# Patient Record
Sex: Female | Born: 1965 | Race: White | Hispanic: No | Marital: Married | Smoking: Never smoker
Health system: Southern US, Community
[De-identification: ages and names within clinical notes are randomized; demographics above are authoritative.]

## PROBLEM LIST (undated history)

## (undated) HISTORY — PX: OTHER SURGICAL HISTORY: SHX169

---

## 2010-01-12 ENCOUNTER — Other Ambulatory Visit
Admission: RE | Admit: 2010-01-12 | Discharge: 2010-01-12 | Payer: Self-pay | Source: Home / Self Care | Admitting: Radiology

## 2013-03-06 ENCOUNTER — Ambulatory Visit: Payer: Self-pay | Admitting: Podiatry

## 2013-03-07 ENCOUNTER — Encounter: Payer: Self-pay | Admitting: Podiatry

## 2013-03-07 ENCOUNTER — Ambulatory Visit (INDEPENDENT_AMBULATORY_CARE_PROVIDER_SITE_OTHER): Payer: PRIVATE HEALTH INSURANCE

## 2013-03-07 ENCOUNTER — Ambulatory Visit (INDEPENDENT_AMBULATORY_CARE_PROVIDER_SITE_OTHER): Payer: PRIVATE HEALTH INSURANCE | Admitting: Podiatry

## 2013-03-07 ENCOUNTER — Ambulatory Visit: Payer: Self-pay | Admitting: Podiatry

## 2013-03-07 VITALS — BP 155/94 | HR 75 | Resp 16 | Ht 62.0 in | Wt 120.0 lb

## 2013-03-07 DIAGNOSIS — M79676 Pain in unspecified toe(s): Secondary | ICD-10-CM

## 2013-03-07 DIAGNOSIS — I739 Peripheral vascular disease, unspecified: Secondary | ICD-10-CM

## 2013-03-07 DIAGNOSIS — M79609 Pain in unspecified limb: Secondary | ICD-10-CM

## 2013-03-07 DIAGNOSIS — M2040 Other hammer toe(s) (acquired), unspecified foot: Secondary | ICD-10-CM

## 2013-03-07 NOTE — Progress Notes (Signed)
   Subjective:    Patient ID: Heidi Robertson, female    DOB: May 01, 1965, 48 y.o.   MRN: [id number]  HPI Comments: "I have a problem with my toes"  Patient C/O red, bruised appearance on the plantar tips of her toes bilateral since November 2014. She first noticed this happening while in [place]. She states that they feel like they are on fire when temps are cold. Sometimes swelling. PCP evaluated, no treatment.    Also has noticed white spots on her toenails  Toe Pain       Review of Systems  All other systems reviewed and are negative.       Objective:   Physical Exam        Assessment & Plan:

## 2013-03-08 NOTE — Progress Notes (Signed)
Subjective:     Patient ID: Heidi Robertson, female   DOB: August 28, 1965, 48 y.o.   MRN: [id number]  Toe Pain    patient states that she exposed her toes to significant cold in November and did a lot of outdoor walking and she did note some change in her digits and that over the last 3 weeks it they have again been exposed and she's noted increased discoloration of the digits but she is concerned about   Review of Systems  All other systems reviewed and are negative.       Objective:   Physical Exam  Nursing note and vitals reviewed. Constitutional: She is oriented to person, place, and time.  Cardiovascular: Intact distal pulses.   Musculoskeletal: Normal range of motion.  Neurological: She is oriented to person, place, and time.  Skin: Skin is warm.   neurovascular status intact with range of motion adequate and toes noted to be well-perfused with good Fill time. 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 I did explain keeping her feet warm and doing gradual soaks and to watch him carefully. If any changes occur let us know and I want her to be more careful next winter when she exposes them to call

## 2018-12-11 ENCOUNTER — Emergency Department (HOSPITAL_COMMUNITY)
Admission: EM | Admit: 2018-12-11 | Discharge: 2018-12-11 | Disposition: A | Discharge status: Home / Self Care | Payer: BLUE CROSS/BLUE SHIELD | Attending: Emergency Medicine | Admitting: Emergency Medicine

## 2018-12-11 ENCOUNTER — Encounter (HOSPITAL_COMMUNITY): Payer: Self-pay

## 2018-12-11 ENCOUNTER — Other Ambulatory Visit: Payer: Self-pay

## 2018-12-11 ENCOUNTER — Emergency Department (HOSPITAL_COMMUNITY): Payer: BLUE CROSS/BLUE SHIELD

## 2018-12-11 DIAGNOSIS — N1330 Unspecified hydronephrosis: Secondary | ICD-10-CM | POA: Insufficient documentation

## 2018-12-11 DIAGNOSIS — R109 Unspecified abdominal pain: Secondary | ICD-10-CM | POA: Diagnosis present

## 2018-12-11 DIAGNOSIS — Z79899 Other long term (current) drug therapy: Secondary | ICD-10-CM | POA: Diagnosis not present

## 2018-12-11 DIAGNOSIS — N201 Calculus of ureter: Secondary | ICD-10-CM | POA: Diagnosis not present

## 2018-12-11 LAB — COMPREHENSIVE METABOLIC PANEL
ALT: 27 U/L (ref 0–44)
AST: 28 U/L (ref 15–41)
Albumin: 4.7 g/dL (ref 3.5–5.0)
Alkaline Phosphatase: 88 U/L (ref 38–126)
Anion gap: 9 (ref 5–15)
BUN: 17 mg/dL (ref 6–20)
CO2: 26 mmol/L (ref 22–32)
Calcium: 9.2 mg/dL (ref 8.9–10.3)
Chloride: 105 mmol/L (ref 98–111)
Creatinine, Ser: 0.73 mg/dL (ref 0.44–1.00)
GFR calc Af Amer: >60 mL/min (ref >60)
GFR calc non Af Amer: >60 mL/min (ref >60)
Glucose, Bld: 116 mg/dL — ABNORMAL HIGH (ref 70–99)
Potassium: 3.8 mmol/L (ref 3.5–5.1)
Sodium: 140 mmol/L (ref 135–145)
Total Bilirubin: 0.8 mg/dL (ref 0.3–1.2)
Total Protein: 7.6 g/dL (ref 6.5–8.1)

## 2018-12-11 LAB — CBC WITH DIFFERENTIAL/PLATELET
Abs Immature Granulocytes: 0.05 10*3/uL (ref 0.00–0.07)
Basophils Absolute: 0.1 10*3/uL (ref 0.0–0.1)
Basophils Relative: 1 %
Eosinophils Absolute: 0.1 10*3/uL (ref 0.0–0.5)
Eosinophils Relative: 1 %
HCT: 40.5 % (ref 36.0–46.0)
Hemoglobin: 13.3 g/dL (ref 12.0–15.0)
Immature Granulocytes: 1 %
Lymphocytes Relative: 11 %
Lymphs Abs: 1.2 10*3/uL (ref 0.7–4.0)
MCH: 30.8 pg (ref 26.0–34.0)
MCHC: 32.8 g/dL (ref 30.0–36.0)
MCV: 93.8 fL (ref 80.0–100.0)
Monocytes Absolute: 0.7 10*3/uL (ref 0.1–1.0)
Monocytes Relative: 6 %
Neutro Abs: 8.9 10*3/uL — ABNORMAL HIGH (ref 1.7–7.7)
Neutrophils Relative %: 80 %
Platelets: 274 10*3/uL (ref 150–400)
RBC: 4.32 MIL/uL (ref 3.87–5.11)
RDW: 12.5 % (ref 11.5–15.5)
WBC: 11 10*3/uL — ABNORMAL HIGH (ref 4.0–10.5)
nRBC: 0 % (ref 0.0–0.2)

## 2018-12-11 LAB — URINALYSIS, ROUTINE W REFLEX MICROSCOPIC
Bacteria, UA: NONE SEEN
Bilirubin Urine: NEGATIVE
Glucose, UA: NEGATIVE mg/dL
Ketones, ur: 5 mg/dL — AB
Leukocytes,Ua: NEGATIVE
Nitrite: NEGATIVE
Protein, ur: NEGATIVE mg/dL
Specific Gravity, Urine: 1.018 (ref 1.005–1.030)
pH: 7 (ref 5.0–8.0)

## 2018-12-11 LAB — POC URINE PREG, ED: Preg Test, Ur: NEGATIVE

## 2018-12-11 LAB — LIPASE, BLOOD: Lipase: 38 U/L (ref 11–51)

## 2018-12-11 MED ORDER — TAMSULOSIN HCL 0.4 MG PO CAPS: 0.4000 mg | ORAL_CAPSULE | Freq: Every day | ORAL | 0 refills | Status: AC

## 2018-12-11 MED ORDER — HYDROCODONE-ACETAMINOPHEN 5-325 MG PO TABS: 1.0000 | ORAL_TABLET | Freq: Four times a day (QID) | ORAL | 0 refills | Status: AC | PRN

## 2018-12-11 MED ORDER — ONDANSETRON HCL 4 MG PO TABS: 4.0000 mg | ORAL_TABLET | Freq: Three times a day (TID) | ORAL | 0 refills | Status: AC | PRN

## 2018-12-11 MED ORDER — IBUPROFEN 600 MG PO TABS: 600.0000 mg | ORAL_TABLET | Freq: Four times a day (QID) | ORAL | 0 refills | Status: AC | PRN

## 2018-12-11 NOTE — ED Provider Notes (Signed)
[facility]-EMERGENCY DEPT Provider Note   CSN: [id number] Arrival date & time: 12/11/18  1723     History   Chief Complaint Chief Complaint  Patient presents with  . Back Pain  . Abdominal Pain  . Hematuria    HPI Heidi Robertson is a 53 y.o. female presents today for evaluation of acute onset, resolved right flank pain.  Reports symptoms began at around 1 PM after eating lunch with her mother.  Over the course of 3 hours the pain became very severe so she sought evaluation at her PCPs office.  Reports sharp pain, could not get comfortable.  Pain radiated from the right flank around to the right groin and she felt a pressure in her pelvis.  Also noted urinary urgency.  Denied hematuria or dysuria.  Nausea but no vomiting.  No diarrhea, constipation, melena, hematochezia, shortness of breath, chest pain, or fevers.  No aggravating relieving factors noted.  Her PCP gave her Phenergan and Toradol with complete resolution of her symptoms.  No known history of kidney stones but reports her father had kidney stones.     The history is provided by the patient.    History reviewed. No pertinent past medical history.  There are no active problems to display for this patient.   Past Surgical History:  Procedure Laterality Date  . aram fracture surgery       OB History   No obstetric history on file.      Home Medications    Prior to Admission medications   Medication Sig Start Date End Date Taking? Authorizing Provider  ALPRAZOLAM PO Take by mouth.    [provider]  Ginkgo Biloba (GINKOBA PO) Take by mouth.    [provider]  HYDROcodone-acetaminophen (NORCO/VICODIN) 5-325 MG tablet Take 1 tablet by mouth every 6 (six) hours as needed for severe pain. 12/11/18   Samon Dishner A, PA-C  ibuprofen (ADVIL) 600 MG tablet Take 1 tablet (600 mg total) by mouth every 6 (six) hours as needed. 12/11/18   Alyss Granato A, PA-C  Multiple Vitamin  (MULTIVITAMIN) capsule Take 1 capsule by mouth daily.    [provider]  ondansetron (ZOFRAN) 4 MG tablet Take 1 tablet (4 mg total) by mouth every 8 (eight) hours as needed for nausea or vomiting. 12/11/18   Shawnia Vizcarrondo A, PA-C  Pseudoephedrine-Acetaminophen (PSEUDOEPHEDRINE SINUS PO) Take by mouth.    [provider]  tamsulosin (FLOMAX) 0.4 MG CAPS capsule Take 1 capsule (0.4 mg total) by mouth daily after supper for 5 days. 12/11/18 12/16/18  Jeanie SewerFawze, Brolin Dambrosia A, PA-C    Family History Family History  Problem Relation Age of Onset  . Cancer Father   . Heart attack Father   . Hypertension Father   . Diabetes Father   . Kidney Stones Father     Social History Social History   Tobacco Use  . Smoking status: Never Smoker  . Smokeless tobacco: Never Used  Substance Use Topics  . Alcohol use: Never    Frequency: Never  . Drug use: Never     Allergies   Patient has no known allergies.   Review of Systems Review of Systems  Constitutional: Negative for chills and fever.  Respiratory: Negative for shortness of breath.   Cardiovascular: Negative for chest pain.  Gastrointestinal: Positive for nausea. Negative for abdominal pain, constipation, diarrhea and vomiting.  Genitourinary: Positive for flank pain, frequency and urgency. Negative for dysuria, vaginal bleeding, vaginal discharge and  vaginal pain.  All other systems reviewed and are negative.    Physical Exam Updated Vital Signs BP (!) 141/89   Pulse 67   Temp 98.4 F (36.9 C) (Oral)   Resp 18   Ht 5\' 2"  (1.575 m)   Wt 59 kg   SpO2 99%   BMI 23.78 kg/m   Physical Exam Vitals signs and nursing note reviewed.  Constitutional:      General: She is not in acute distress.    Appearance: She is well-developed.  HENT:     Head: Normocephalic and atraumatic.  Eyes:     General:        Right eye: No discharge.        Left eye: No discharge.     Conjunctiva/sclera: Conjunctivae normal.  Neck:      Vascular: No JVD.     Trachea: No tracheal deviation.  Cardiovascular:     Rate and Rhythm: Normal rate and regular rhythm.  Pulmonary:     Effort: Pulmonary effort is normal.     Breath sounds: Normal breath sounds.  Abdominal:     General: Abdomen is flat. Bowel sounds are normal. There is no distension.     Palpations: Abdomen is soft.     Tenderness: There is abdominal tenderness in the suprapubic area. There is no right CVA tenderness, left CVA tenderness, guarding or rebound. Negative signs include Murphy's sign and Rovsing's sign.  Skin:    General: Skin is warm and dry.     Findings: No erythema.  Neurological:     Mental Status: She is alert.  Psychiatric:        Behavior: Behavior normal.      ED Treatments / Results  Labs (all labs ordered are listed, but only abnormal results are displayed) Labs Reviewed  URINALYSIS, ROUTINE W REFLEX MICROSCOPIC - Abnormal; Notable for the following components:      Result Value   APPearance HAZY (*)    Hgb urine dipstick SMALL (*)    Ketones, ur 5 (*)    All other components within normal limits  CBC WITH DIFFERENTIAL/PLATELET - Abnormal; Notable for the following components:   WBC 11.0 (*)    Neutro Abs 8.9 (*)    All other components within normal limits  COMPREHENSIVE METABOLIC PANEL - Abnormal; Notable for the following components:   Glucose, Bld 116 (*)    All other components within normal limits  LIPASE, BLOOD  POC URINE PREG, ED    EKG None  Radiology Ct Renal Stone Study  Result Date: 12/11/2018 CLINICAL DATA:  Flank pain with stone disease suspected. Right-sided flank and back pain. EXAM: CT ABDOMEN AND PELVIS WITHOUT CONTRAST TECHNIQUE: Multidetector CT imaging of the abdomen and pelvis was performed following the standard protocol without IV contrast. COMPARISON:  None. FINDINGS: Lower chest: The lung bases are clear. The heart size is normal. Hepatobiliary: There is a 1.7 cm cyst in the right hepatic lobe.  Normal gallbladder.There is no biliary ductal dilation. Pancreas: Normal contours without ductal dilatation. No peripancreatic fluid collection. Spleen: No splenic laceration or hematoma. Adrenals/Urinary Tract: --Adrenal glands: No adrenal hemorrhage. --Right kidney/ureter: There is moderate right-sided hydroureteronephrosis secondary to a 3 mm stone in the distal right ureter at the right UVJ. --Left kidney/ureter: There are small punctate nonobstructing stones in lower pole the left kidney. --Urinary bladder: The urinary bladder is decompressed which limits evaluation. Stomach/Bowel: --Stomach/Duodenum: No hiatal hernia or other gastric abnormality. Normal duodenal course and caliber. --Small  bowel: No dilatation or inflammation. --Colon: No focal abnormality. --Appendix: Normal. Vascular/Lymphatic: Normal course and caliber of the major abdominal vessels. --No retroperitoneal lymphadenopathy. --No mesenteric lymphadenopathy. --No pelvic or inguinal lymphadenopathy. Reproductive: Unremarkable Other: No ascites or free air. The abdominal wall is normal. Musculoskeletal. No acute displaced fractures. IMPRESSION: 1. Moderate right-sided hydroureteronephrosis secondary to a 3 mm stone in the distal right ureter at the right UVJ. 2. Nonobstructive left nephrolithiasis. Electronically Signed   By: Constance Holster M.D.   On: 12/11/2018 19:47    Procedures Procedures (including critical care time)  Medications Ordered in ED Medications - No data to display   Initial Impression / Assessment and Plan / ED Course  I have reviewed the triage vital signs and the nursing notes.  Pertinent labs & imaging results that were available during my care of the patient were reviewed by me and considered in my medical decision making (see chart for details).        Patient presents for evaluation of acute onset, resolved episode of severe colicky right flank pain radiating to the groin with associated urinary  symptoms.  Went to her PCP who noted blood in her urine and gave her Toradol and Phenergan with resolution of her symptoms.  She is afebrile, mildly hypertensive in the ED but vital signs otherwise stable and she is resting comfortably in chair.  Nontoxic in appearance.  No tenderness to palpation of the midline spine, no red flag signs concerning for cauda equina or spinal abscess and she is ambulatory in the ED without difficulty.  Abdomen is soft and entirely nontender.  Her UA does not suggest UTI but does have blood in it suggesting possible nephrolithiasis.  Remainder of blood work reviewed by me shows mild nonspecific leukocytosis, no anemia, no renal insufficiency, no metabolic derangements.  She has no vaginal itching, bleeding, or discharge and I doubt PID, TOA, ovarian torsion, or ectopic pregnancy.  Renal stone study was obtained which shows moderate right-sided hydroureteronephrosis secondary to a 3 mm stone in the right UVJ.  No evidence of acute surgical abdominal pathology.  On reevaluation patient remains resting comfortably in no apparent distress.  She has had no recurrence in her symptoms.  Discussed management with NSAIDs, Flomax, Zofran for nausea, hydrocodone for severe breakthrough pain while she is passing the stone.  She will collect the stone to take to urology for follow-up on an outpatient basis.  Discussed strict ED return precautions. Pt verbalized understanding of and agreement with plan and is safe for discharge home at this time.   Final Clinical Impressions(s) / ED Diagnoses   Final diagnoses:  Right ureteral calculus  Hydronephrosis of right kidney    ED Discharge Orders         Ordered    ibuprofen (ADVIL) 600 MG tablet  Every 6 hours PRN     12/11/18 2045    HYDROcodone-acetaminophen (NORCO/VICODIN) 5-325 MG tablet  Every 6 hours PRN     12/11/18 2045    tamsulosin (FLOMAX) 0.4 MG CAPS capsule  Daily after supper     12/11/18 2045    ondansetron (ZOFRAN) 4 MG  tablet  Every 8 hours PRN     12/11/18 2046           Renita Papa, PA-C 12/12/18 1146    Valarie Merino, MD 12/15/18 1623

## 2018-12-11 NOTE — ED Triage Notes (Signed)
Patient states that she began having right lower back pain and right lower abdominal pain, vaginal pressure today. Patient went to her PCP today and was told to come to the ED due to hematuria and a negative urinalysis. Patient denies any pain at this time. Patient states she was given a pain injection prior to coming to the ED.

## 2018-12-11 NOTE — ED Notes (Signed)
An After Visit Summary was printed and given to the patient. Discharge instructions given and no further questions at this time. Pt leaving with urine filter and hat for urinating.

## 2018-12-11 NOTE — Discharge Instructions (Signed)
1. Medications: Take 600 mg of ibuprofen every 6 hours as needed for pain. Take ibuprofen with food to avoid upset stomach issues.  Take hydrocodone as needed for severe pain but do not drive, drink alcohol, operate heavy machinery while taking this medication as it can cause drowsiness.  Take Zofran as needed for nausea.  Let this medicine dissolve under your tongue and wait around 10-15 minutes before eating or drinking after taking this medication to give it time to work.  Take Flomax as prescribed.  This medicine will open up the ureter(the tube connecting the bladder to the kidney) to allow for easier passage of the kidney stone.  Be aware this medication can cause a drop in your blood pressure so be careful when going from laying or sitting to standing as you may feel lightheaded.  If you feel very fatigued, lightheaded, or have persistently low blood pressures, stop taking this medication entirely. 2. Treatment: rest, drink plenty of fluids.  3. Follow Up: Please followup with urology as soon as possible (preferably this week) for discussion of your diagnoses and further evaluation after today's visit; Please return to the ER for persistent vomiting, high fevers or worsening symptoms

## 2020-03-31 IMAGING — CT CT RENAL STONE PROTOCOL
2 of 4 series · 16 of 46 positions shown, 18 images · non-contrast
Comparison: None.

CLINICAL DATA: Flank pain with stone disease suspected. Right-sided
flank and back pain.

EXAM:
CT ABDOMEN AND PELVIS WITHOUT CONTRAST
TECHNIQUE: Multidetector CT imaging of the abdomen and pelvis was performed
following the standard protocol without IV contrast.

[Series 2: axial st · axial · 0.64mm/px · z∈[-463,-93]mm · 13 of 84 slices shown, 15 images]
[im 5/84  soft-tissue]
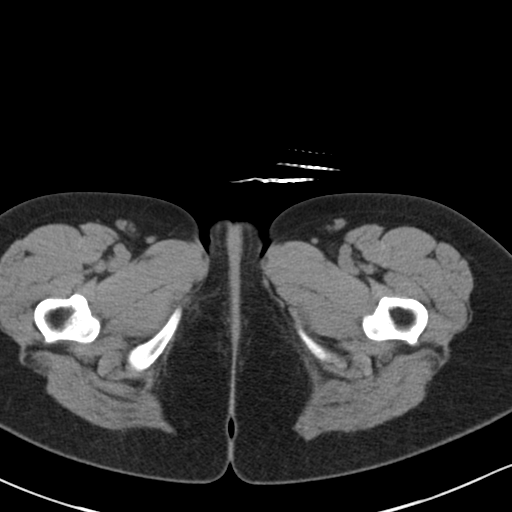
[im 5/84  bone]
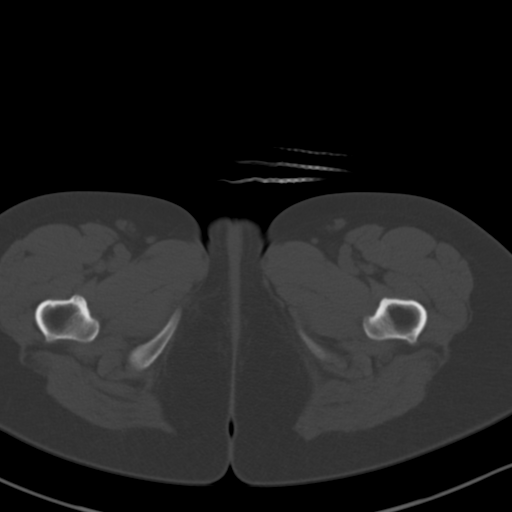
[im 10/84  soft-tissue]
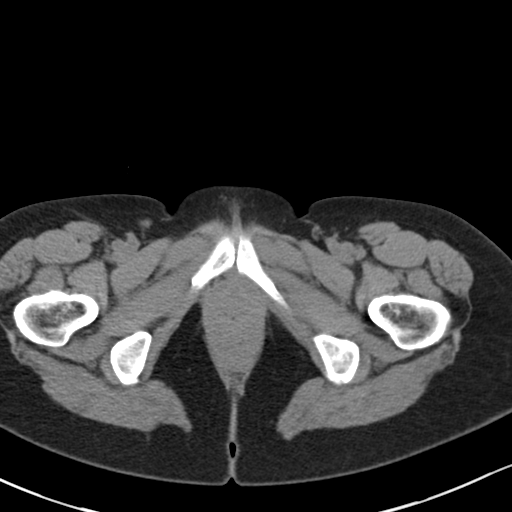
[im 20/84  soft-tissue]
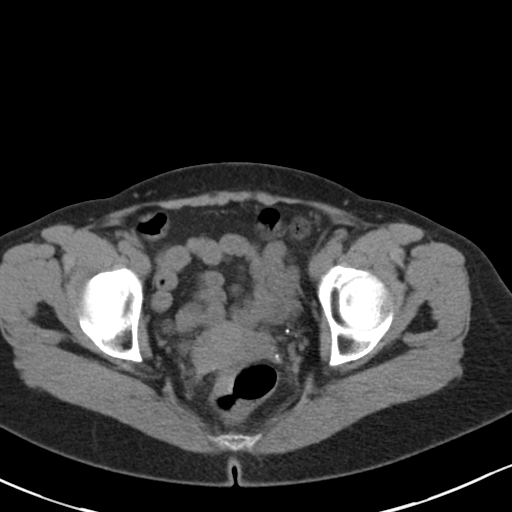
[im 25/84  soft-tissue]
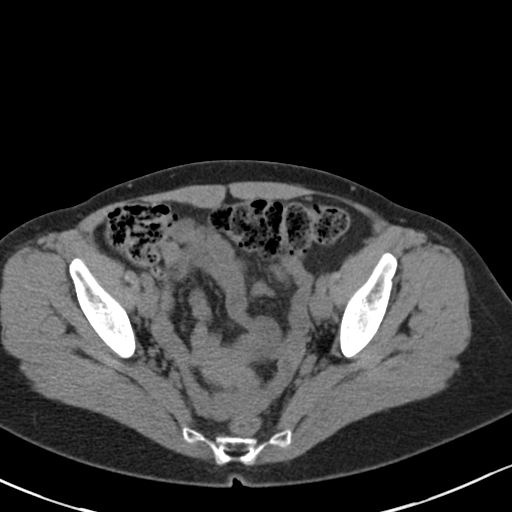
[im 30/84  soft-tissue]
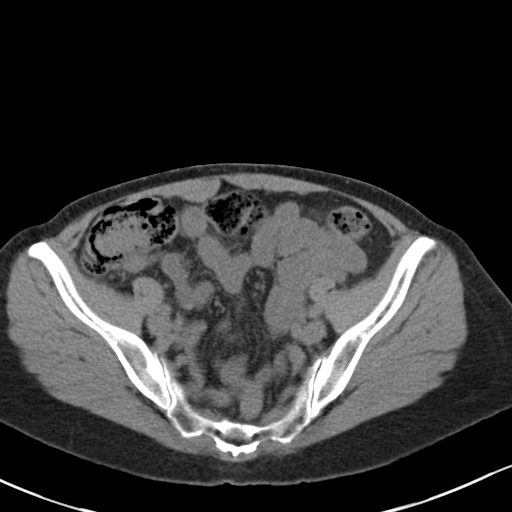
[im 35/84  soft-tissue]
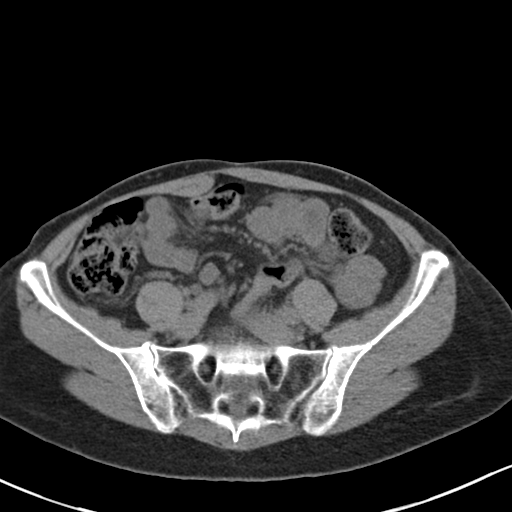
[im 44/84  soft-tissue]
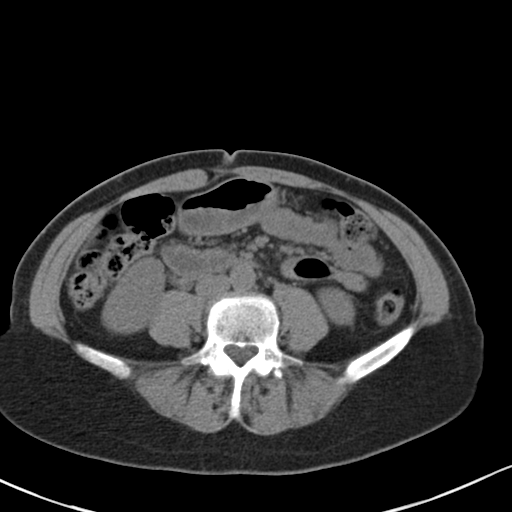
[im 49/84  soft-tissue]
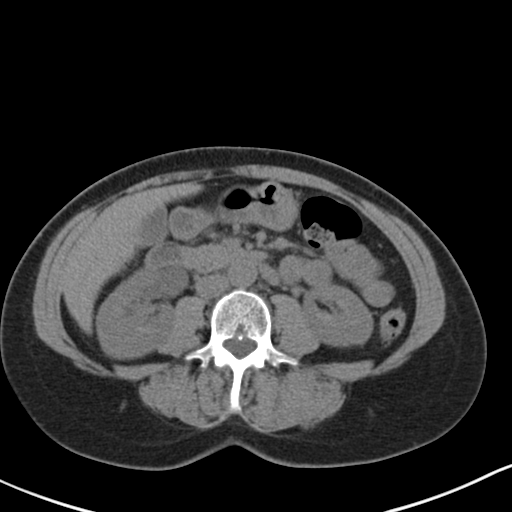
[im 54/84  soft-tissue]
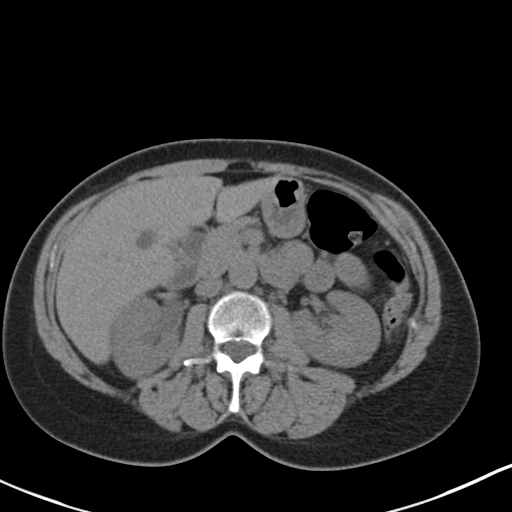
[im 54/84  bone]
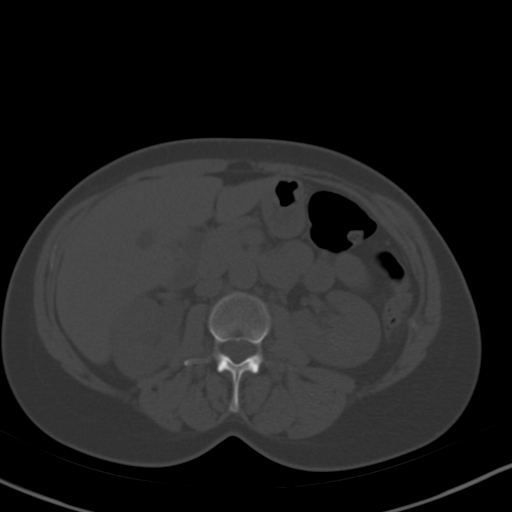
[im 59/84  soft-tissue]
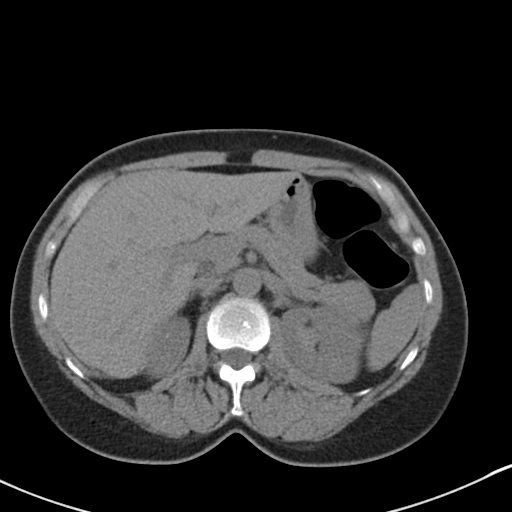
[im 64/84  soft-tissue]
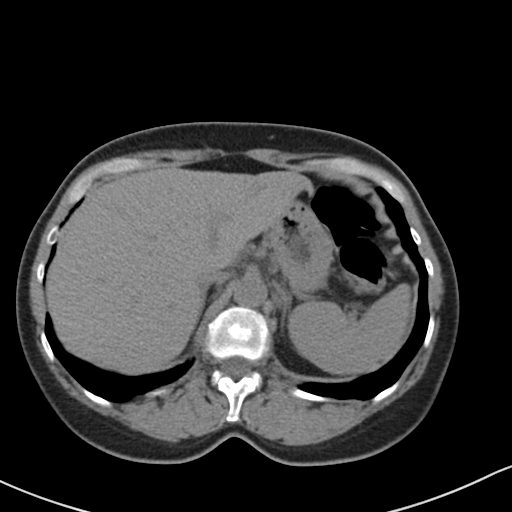
[im 74/84  soft-tissue]
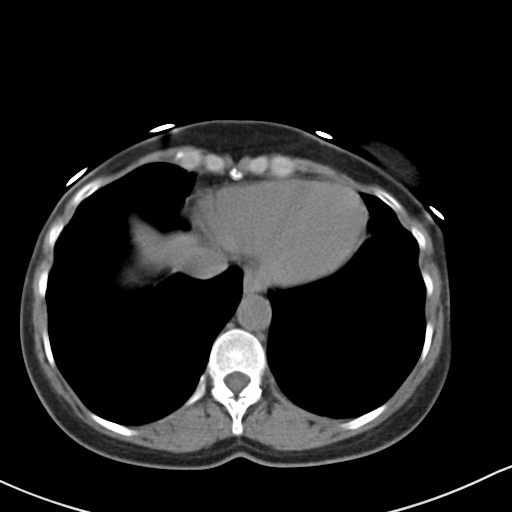
[im 79/84  soft-tissue]
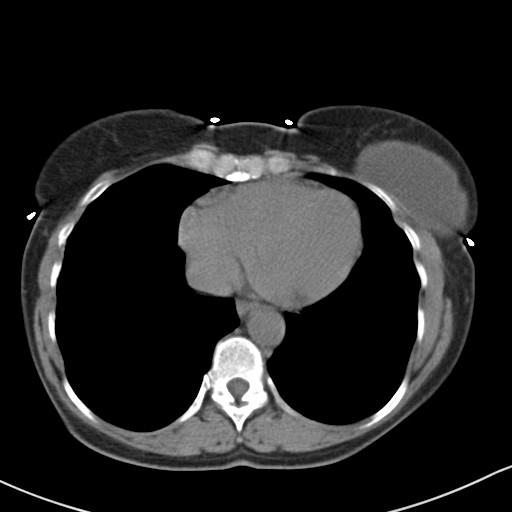

[Series 5: coronal · coronal · 0.63mm/px · 3 of 121 slices shown]
[im 41/121  soft-tissue]
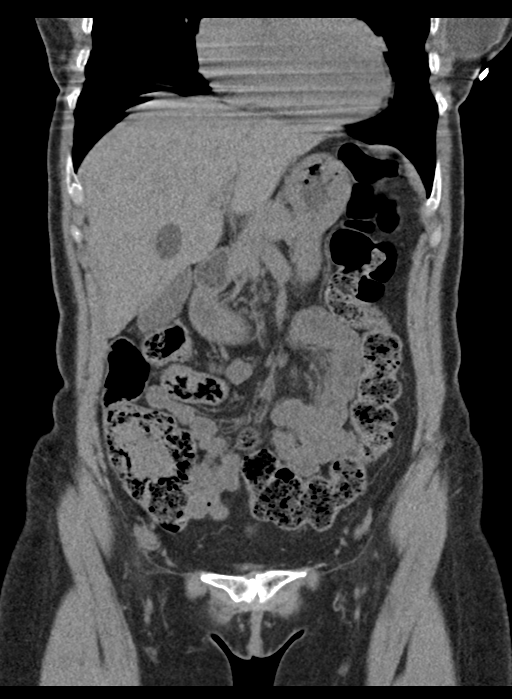
[im 54/121  soft-tissue]
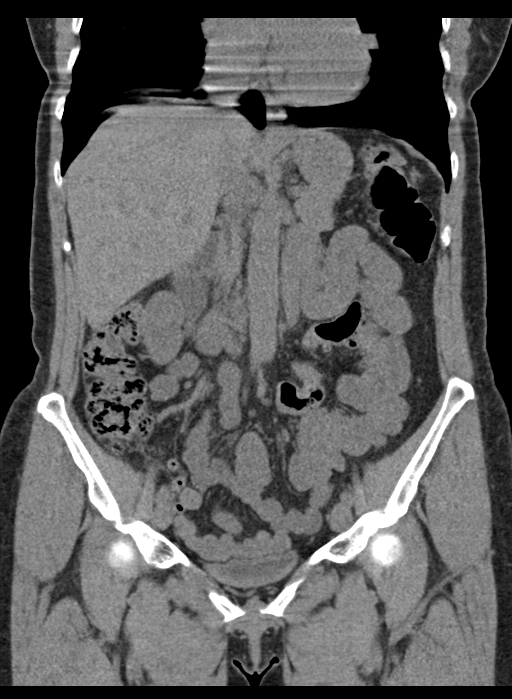
[im 67/121  soft-tissue]
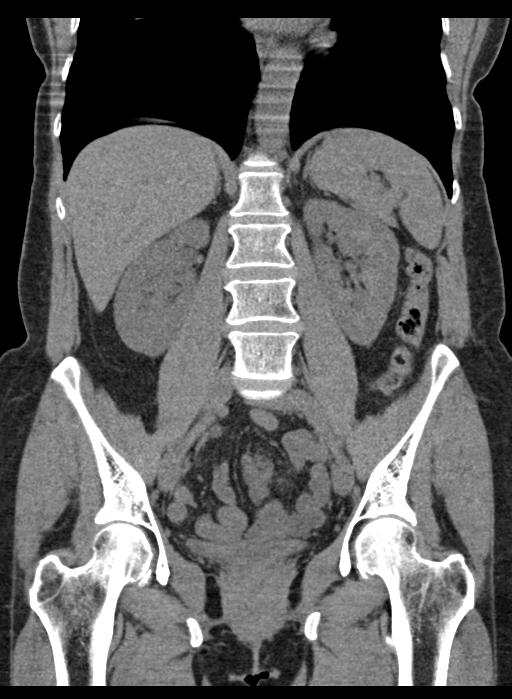

[16 of 46 positions shown; findings below may reference images not displayed]

FINDINGS: Lower chest: The lung bases are clear. The heart size is normal.

Hepatobiliary: There is a 1.7 cm cyst in the right hepatic lobe.
Normal gallbladder.There is no biliary ductal dilation.

Pancreas: Normal contours without ductal dilatation. No
peripancreatic fluid collection.

Spleen: No splenic laceration or hematoma.

Adrenals/Urinary Tract:

--Adrenal glands: No adrenal hemorrhage.

--Right kidney/ureter: There is moderate right-sided
hydroureteronephrosis secondary to a 3 mm stone in the distal right
ureter at the right UVJ.

--Left kidney/ureter: There are small punctate nonobstructing stones
in lower pole the left kidney.

--Urinary bladder: The urinary bladder is decompressed which limits
evaluation.

Stomach/Bowel:

--Stomach/Duodenum: No hiatal hernia or other gastric abnormality.
Normal duodenal course and caliber.

--Small bowel: No dilatation or inflammation.

--Colon: No focal abnormality.

--Appendix: Normal.

Vascular/Lymphatic: Normal course and caliber of the major abdominal
vessels.

--No retroperitoneal lymphadenopathy.

--No mesenteric lymphadenopathy.

--No pelvic or inguinal lymphadenopathy.

Reproductive: Unremarkable

Other: No ascites or free air. The abdominal wall is normal.

Musculoskeletal. No acute displaced fractures.
IMPRESSION: 1. Moderate right-sided hydroureteronephrosis secondary to a 3 mm
stone in the distal right ureter at the right UVJ.
2. Nonobstructive left nephrolithiasis.
